# Patient Record
Sex: Male | Born: 1980 | Race: Black or African American | Hispanic: No | Marital: Single | Smoking: Never smoker
Health system: Southern US, Community
[De-identification: ages and names within clinical notes are randomized; demographics above are authoritative.]

## PROBLEM LIST (undated history)

## (undated) DIAGNOSIS — Z789 Other specified health status: Secondary | ICD-10-CM

## (undated) HISTORY — PX: KNEE ARTHROSCOPY: SHX127

## (undated) HISTORY — PX: HERNIA REPAIR: SHX51

---

## 2012-12-29 ENCOUNTER — Emergency Department: Payer: Self-pay | Admitting: Emergency Medicine

## 2013-03-02 ENCOUNTER — Emergency Department: Payer: Self-pay | Admitting: Emergency Medicine

## 2014-09-02 ENCOUNTER — Emergency Department: Payer: Self-pay | Admitting: Emergency Medicine

## 2014-09-11 ENCOUNTER — Ambulatory Visit
Admit: 2014-09-11 | Disposition: A | Discharge status: Home / Self Care | Payer: Self-pay | Attending: Orthopedic Surgery | Admitting: Orthopedic Surgery

## 2014-09-11 LAB — URINALYSIS, COMPLETE
Bilirubin,UR: NEGATIVE
Glucose,UR: NEGATIVE mg/dL (ref 0–75)
Ketone: NEGATIVE
LEUKOCYTE ESTERASE: NEGATIVE
Nitrite: NEGATIVE
PH: 5 (ref 4.5–8.0)
Protein: NEGATIVE
Specific Gravity: 1.01 (ref 1.003–1.030)

## 2014-09-11 LAB — CBC
HCT: 45.6 % (ref 40.0–52.0)
HGB: 15.1 g/dL (ref 13.0–18.0)
MCH: 30.7 pg (ref 26.0–34.0)
MCHC: 33.1 g/dL (ref 32.0–36.0)
MCV: 93 fL (ref 80–100)
PLATELETS: 327 10*3/uL (ref 150–440)
RBC: 4.92 10*6/uL (ref 4.40–5.90)
RDW: 13.2 % (ref 11.5–14.5)
WBC: 4.5 10*3/uL (ref 3.8–10.6)

## 2014-09-11 LAB — BASIC METABOLIC PANEL
Anion Gap: 10 (ref 7–16)
BUN: 11 mg/dL
CALCIUM: 9.4 mg/dL
CO2: 26 mmol/L
CREATININE: 1.2 mg/dL
Chloride: 107 mmol/L
EGFR (African American): >60
EGFR (Non-African Amer.): >60
Glucose: 119 mg/dL — ABNORMAL HIGH
POTASSIUM: 3.8 mmol/L
SODIUM: 143 mmol/L

## 2014-09-11 LAB — PROTIME-INR
INR: 1
Prothrombin Time: 13.3 secs

## 2014-09-11 LAB — APTT: ACTIVATED PTT: 33.3 s (ref 23.6–35.9)

## 2014-10-05 NOTE — Op Note (Signed)
PATIENT NAME:  Jose Lambert, Jose Lambert MR#:  [id number] DATE OF BIRTH:  03/14/1981  DATE OF PROCEDURE:  09/11/2014  PREOPERATIVE DIAGNOSIS: Left patella tendon rupture.   POSTOPERATIVE DIAGNOSIS: Left patella tendon rupture.  PROCEDURE: Open left knee patella tendon repair.   ANESTHESIA: General with local (0.25% Marcaine plain).   SURGEON: Thornton Park, MD.   ESTIMATED BLOOD LOSS: 100 mL.   COMPLICATIONS: None.   INDICATIONS FOR PROCEDURE: The patient is a 34 year old male, who is approximately 1 week out from an injury to his left knee. He was playing basketball. When he went up for a layup, he felt a pop in his left knee. The patient has a palpable defect in this patella tendon is unable to actively extend his leg and perform a straight leg raise. I recommended a surgical repair to the left knee and then the patient has agreed. He understands the risks include infection, bleeding, nerve or blood vessel injury, knee stiffness, persistent left knee pain, rerupture of the patella tendon, development of arthritis, and the need for further surgery. Medical risks include, but are not limited to deep vein thrombosis and pulmonary embolism, myocardial infarction, stroke, pneumonia, respiratory failure and death. The patient understood these risks and wished to proceed.   PROCEDURE NOTE : The patient was met in preoperative area. I marked the left knee with my initials and the word "yes" according to the hospital's correct site of surgery protocol. I answered all questions by the patient and his mother. He was then brought to the Operating Room where he was placed supine on the operative table. He was prepped and draped in a sterile fashion. A timeout was performed to verify the patient's name, date of birth, medical record number, correct site of surgery and correct procedure to be performed. It was also used to verify the patient had received antibiotics and appropriate instruments, implants, and  radiographic studies were available in the room. Once all in attendance were in agreement, the case began.   DESCRIPTION OF PROCEDURE: The patient had a midline incision made extending from the tibial tubercle to just superior to the patella. The subcutaneous tissues were carefully dissected with a Metzenbaum scissor and pickup. The peritenon of the patella tendon was sharply incised with a #15 blade. Full thickness skin flaps had been developed with a #10 blade. The patella tendon was then easily identified. It was whipstitched with #5 fiber wire, both on the medial and lateral side. Once it was completely whipstitched, there were 4 strands of suture. The knee joint was copiously irrigated. All hemarthrosis was suctioned out. The patient had torn his medial and lateral retinaculum as well. There were no focal chondral defects of the patella or trochlea of the femur. The torn patella tendon fibers were debrided off the inferior pole of the patella. The sutures from the patellar tendon were then advanced through bone tunnels in the patella, one centrally and one on either side of the central ridge. These sutures were passed through the patella using a long drill needle. The eyelid of these drill pins allowed for passage of the suture through the tendon. Two suture limbs were placed in the central hole and one through each of the side holes. The tendons of the sutures were then tied tightly down to approximate the patella with the patellar tendon. These were tied over the superior aspect of the patella. Once well approximated, the patella tendon and patella were oversewed with 0 Ethibond. The 0 Ethibond was also used to repair  the medial lateral retinaculum. The wound was copiously irrigated. The peritenon was then repaired using a 3-0 Vicryl. The subcutaneous tissue was closed with an interrupted 0 Vicryl and the subcutaneous tissue was closed with a 2-0 Vicryl. The patient's skin was approximated with staples. A  dry sterile and compressive dressing was applied over the left knee, along with a Polar Care sleeve and a hinged knee brace locked in extension. I was scrubbed and present for the entire case, and all sharp and instrument counts were correct at the conclusion of the case. The patient was then brought to the PACU in stable condition. I spoke with his mother in the postoperative consultation room to let her know the case had gone without complication. The patient was stable in the recovery room.    ____________________________ Timoteo Gaul, MD klk:AT D: 09/12/2014 13:51:23 ET T: 09/12/2014 14:10:06 ET JOB#: [id number]  cc: Timoteo Gaul, MD, <Dictator> Timoteo Gaul MD ELECTRONICALLY SIGNED 10/01/2014 13:24

## 2014-12-02 ENCOUNTER — Encounter: Payer: Self-pay | Admitting: *Deleted

## 2014-12-02 ENCOUNTER — Other Ambulatory Visit: Payer: Self-pay

## 2014-12-02 NOTE — Patient Instructions (Signed)
  Your procedure is scheduled on: 12-05-14 Report to [facility] 2ND FLOOR To find out your arrival time please call [phone] between 1PM - 3PM on 12-04-14 (THURSDAY)  Remember: Instructions that are not followed completely may result in serious medical risk, up to and including death, or upon the discretion of your surgeon and anesthesiologist your surgery may need to be rescheduled.    _X___ 1. Do not eat food or drink liquids after midnight. No gum chewing or hard candies.     _X___ 2. No Alcohol for 24 hours before or after surgery.   ____ 3. Bring all medications with you on the day of surgery if instructed.    _X___ 4. Notify your doctor if there is any change in your medical condition     (cold, fever, infections).     Do not wear jewelry, make-up, hairpins, clips or nail polish.  Do not wear lotions, powders, or perfumes. You may wear deodorant.  Do not shave 48 hours prior to surgery. Men may shave face and neck.  Do not bring valuables to the hospital.    [facility] for any belongings or valuables.               Contacts, dentures or bridgework may not be worn into surgery.  Leave your suitcase in the car. After surgery it may be brought to your room.  For patients admitted to the hospital, discharge time is determined by your treatment team.   Patients discharged the day of surgery will not be allowed to drive home.   Please read over the following fact sheets that you were given:    _X___ Take these medicines the morning of surgery with A SIP OF WATER:    1. MAY TAKE VICODIN IF NEEDED WITH A SMALL SIP OF WATER  2.   3.   4.  5.  6.  ____ Fleet Enema (as directed)   _X___ Use CHG Soap as directed  ____ Use inhalers on the day of surgery  ____ Stop metformin 2 days prior to surgery    ____ Take 1/2 of usual insulin dose the night before surgery and none on the morning of surgery.   ____ Stop  Coumadin/Plavix/aspirin-N/A  ____ Stop Anti-inflammatories-NO NSAIDS OR ASPIRIN PRODUCTS-VICODIN OK TO CONTINUE   ____ Stop supplements until after surgery.    ____ Bring C-Pap to the hospital.

## 2014-12-03 ENCOUNTER — Encounter
Admission: RE | Admit: 2014-12-03 | Discharge: 2014-12-03 | Disposition: A | Discharge status: Home / Self Care | Payer: Self-pay | Source: Ambulatory Visit | Attending: Orthopedic Surgery | Admitting: Orthopedic Surgery

## 2014-12-03 DIAGNOSIS — Z01812 Encounter for preprocedural laboratory examination: Secondary | ICD-10-CM | POA: Insufficient documentation

## 2014-12-03 LAB — URINALYSIS COMPLETE WITH MICROSCOPIC (ARMC ONLY)
BILIRUBIN URINE: NEGATIVE
Bacteria, UA: NONE SEEN
GLUCOSE, UA: NEGATIVE mg/dL
KETONES UR: NEGATIVE mg/dL
Leukocytes, UA: NEGATIVE
Nitrite: NEGATIVE
PH: 6 (ref 5.0–8.0)
Protein, ur: NEGATIVE mg/dL
Specific Gravity, Urine: 1.018 (ref 1.005–1.030)

## 2014-12-03 LAB — APTT: aPTT: 35 seconds (ref 24–36)

## 2014-12-03 LAB — BASIC METABOLIC PANEL
ANION GAP: 10 (ref 5–15)
BUN: 13 mg/dL (ref 6–20)
CHLORIDE: 105 mmol/L (ref 101–111)
CO2: 27 mmol/L (ref 22–32)
Calcium: 9.7 mg/dL (ref 8.9–10.3)
Creatinine, Ser: 1.26 mg/dL — ABNORMAL HIGH (ref 0.61–1.24)
GFR calc Af Amer: >60 mL/min (ref >60)
GFR calc non Af Amer: >60 mL/min (ref >60)
Glucose, Bld: 91 mg/dL (ref 65–99)
Potassium: 4 mmol/L (ref 3.5–5.1)
SODIUM: 142 mmol/L (ref 135–145)

## 2014-12-03 LAB — CBC
HCT: 40.9 % (ref 40.0–52.0)
Hemoglobin: 13.8 g/dL (ref 13.0–18.0)
MCH: 31.3 pg (ref 26.0–34.0)
MCHC: 33.7 g/dL (ref 32.0–36.0)
MCV: 92.9 fL (ref 80.0–100.0)
Platelets: 343 10*3/uL (ref 150–440)
RBC: 4.4 MIL/uL (ref 4.40–5.90)
RDW: 13.8 % (ref 11.5–14.5)
WBC: 6.9 10*3/uL (ref 3.8–10.6)

## 2014-12-03 LAB — PROTIME-INR
INR: 1.13
Prothrombin Time: 14.7 seconds (ref 11.4–15.0)

## 2014-12-05 ENCOUNTER — Observation Stay
Admission: RE | Admit: 2014-12-05 | Discharge: 2014-12-06 | Disposition: A | Discharge status: Home / Self Care | Payer: Self-pay | Source: Ambulatory Visit | Attending: Orthopedic Surgery | Admitting: Orthopedic Surgery

## 2014-12-05 ENCOUNTER — Encounter: Payer: Self-pay | Admitting: *Deleted

## 2014-12-05 ENCOUNTER — Encounter
Admission: RE | Disposition: A | Discharge status: Home / Self Care | Payer: Self-pay | Source: Ambulatory Visit | Attending: Orthopedic Surgery

## 2014-12-05 ENCOUNTER — Ambulatory Visit: Payer: MEDICAID | Admitting: Anesthesiology

## 2014-12-05 ENCOUNTER — Ambulatory Visit: Payer: Self-pay

## 2014-12-05 ENCOUNTER — Ambulatory Visit: Payer: Self-pay | Admitting: Anesthesiology

## 2014-12-05 DIAGNOSIS — S76112A Strain of left quadriceps muscle, fascia and tendon, initial encounter: Principal | ICD-10-CM | POA: Insufficient documentation

## 2014-12-05 DIAGNOSIS — Z7982 Long term (current) use of aspirin: Secondary | ICD-10-CM | POA: Insufficient documentation

## 2014-12-05 DIAGNOSIS — S86819A Strain of other muscle(s) and tendon(s) at lower leg level, unspecified leg, initial encounter: Secondary | ICD-10-CM | POA: Diagnosis present

## 2014-12-05 DIAGNOSIS — I10 Essential (primary) hypertension: Secondary | ICD-10-CM | POA: Insufficient documentation

## 2014-12-05 DIAGNOSIS — Z79899 Other long term (current) drug therapy: Secondary | ICD-10-CM | POA: Insufficient documentation

## 2014-12-05 DIAGNOSIS — W19XXXA Unspecified fall, initial encounter: Secondary | ICD-10-CM | POA: Insufficient documentation

## 2014-12-05 HISTORY — DX: Other specified health status: Z78.9

## 2014-12-05 HISTORY — PX: PATELLAR TENDON REPAIR: SHX737

## 2014-12-05 SURGERY — REPAIR, TENDON, PATELLAR
Anesthesia: General | Site: Knee | Laterality: Left | Wound class: Clean

## 2014-12-05 MED ORDER — ACETAMINOPHEN 325 MG PO TABS: 650.0000 mg | ORAL_TABLET | Freq: Four times a day (QID) | ORAL | Status: DC | PRN

## 2014-12-05 MED ORDER — BISACODYL 5 MG PO TBEC: 5.0000 mg | DELAYED_RELEASE_TABLET | Freq: Every day | ORAL | Status: DC | PRN

## 2014-12-05 MED ORDER — LIDOCAINE HCL (CARDIAC) 20 MG/ML IV SOLN
INTRAVENOUS | Status: DC | PRN
  Administered 2014-12-05: 50 mg via INTRAVENOUS

## 2014-12-05 MED ORDER — MIDAZOLAM HCL 2 MG/2ML IJ SOLN
INTRAMUSCULAR | Status: DC | PRN
  Administered 2014-12-05: 2 mg via INTRAVENOUS

## 2014-12-05 MED ORDER — HYDROMORPHONE HCL 1 MG/ML IJ SOLN
INTRAMUSCULAR | Status: AC
  Filled 2014-12-05: qty 1

## 2014-12-05 MED ORDER — KETOROLAC TROMETHAMINE 15 MG/ML IJ SOLN
15.0000 mg | Freq: Four times a day (QID) | INTRAMUSCULAR | Status: DC
  Administered 2014-12-05 – 2014-12-06 (×3): 15 mg via INTRAVENOUS
  Filled 2014-12-05 (×3): qty 1

## 2014-12-05 MED ORDER — MAGNESIUM HYDROXIDE 400 MG/5ML PO SUSP: 30.0000 mL | Freq: Every day | ORAL | Status: DC | PRN

## 2014-12-05 MED ORDER — LACTATED RINGERS IV SOLN
INTRAVENOUS | Status: DC | PRN
  Administered 2014-12-05 (×2): via INTRAVENOUS

## 2014-12-05 MED ORDER — OXYCODONE HCL 5 MG PO TABS
5.0000 mg | ORAL_TABLET | ORAL | Status: DC | PRN
  Administered 2014-12-05 – 2014-12-06 (×3): 5 mg via ORAL
  Filled 2014-12-05 (×3): qty 1

## 2014-12-05 MED ORDER — ACETAMINOPHEN 650 MG RE SUPP: 650.0000 mg | Freq: Four times a day (QID) | RECTAL | Status: DC | PRN

## 2014-12-05 MED ORDER — ONDANSETRON HCL 4 MG PO TABS: 4.0000 mg | ORAL_TABLET | Freq: Four times a day (QID) | ORAL | Status: DC | PRN

## 2014-12-05 MED ORDER — PROPOFOL 10 MG/ML IV BOLUS
INTRAVENOUS | Status: DC | PRN
  Administered 2014-12-05: 150 mg via INTRAVENOUS
  Administered 2014-12-05: 50 mg via INTRAVENOUS

## 2014-12-05 MED ORDER — FAMOTIDINE 20 MG PO TABS
ORAL_TABLET | ORAL | Status: AC
  Filled 2014-12-05: qty 1

## 2014-12-05 MED ORDER — ACETAMINOPHEN 10 MG/ML IV SOLN
INTRAVENOUS | Status: DC | PRN
  Administered 2014-12-05: 1000 mg via INTRAVENOUS

## 2014-12-05 MED ORDER — NEOMYCIN-POLYMYXIN B GU 40-200000 IR SOLN
Status: DC | PRN
  Administered 2014-12-05: 16 mL

## 2014-12-05 MED ORDER — HYDROMORPHONE HCL 1 MG/ML IJ SOLN
INTRAMUSCULAR | Status: AC
  Administered 2014-12-05: 0.5 mg via INTRAVENOUS
  Filled 2014-12-05: qty 1

## 2014-12-05 MED ORDER — NEOMYCIN-POLYMYXIN B GU 40-200000 IR SOLN
Status: AC
  Filled 2014-12-05: qty 20

## 2014-12-05 MED ORDER — BUPIVACAINE HCL 0.25 % IJ SOLN
INTRAMUSCULAR | Status: DC | PRN
  Administered 2014-12-05: 30 mL

## 2014-12-05 MED ORDER — HYDROMORPHONE HCL 1 MG/ML IJ SOLN
0.2500 mg | INTRAMUSCULAR | Status: DC | PRN
  Administered 2014-12-05 (×4): 0.5 mg via INTRAVENOUS

## 2014-12-05 MED ORDER — DOCUSATE SODIUM 100 MG PO CAPS
100.0000 mg | ORAL_CAPSULE | Freq: Two times a day (BID) | ORAL | Status: DC
  Administered 2014-12-05 – 2014-12-06 (×2): 100 mg via ORAL
  Filled 2014-12-05 (×2): qty 1

## 2014-12-05 MED ORDER — FENTANYL CITRATE (PF) 100 MCG/2ML IJ SOLN
INTRAMUSCULAR | Status: DC | PRN
Start: 2014-12-05 — End: 2014-12-05
  Administered 2014-12-05 (×4): 50 ug via INTRAVENOUS
  Administered 2014-12-05: 100 ug via INTRAVENOUS
  Administered 2014-12-05 (×2): 50 ug via INTRAVENOUS

## 2014-12-05 MED ORDER — CEFAZOLIN SODIUM-DEXTROSE 2-3 GM-% IV SOLR
INTRAVENOUS | Status: DC | PRN
  Administered 2014-12-05: 2 g via INTRAVENOUS

## 2014-12-05 MED ORDER — FENTANYL CITRATE (PF) 100 MCG/2ML IJ SOLN
INTRAMUSCULAR | Status: AC
  Administered 2014-12-05: 25 ug via INTRAVENOUS
  Filled 2014-12-05: qty 2

## 2014-12-05 MED ORDER — HYDROMORPHONE HCL 1 MG/ML IJ SOLN
1.0000 mg | INTRAMUSCULAR | Status: DC | PRN
  Administered 2014-12-05 – 2014-12-06 (×2): 1 mg via INTRAVENOUS
  Filled 2014-12-05 (×2): qty 1

## 2014-12-05 MED ORDER — ACETAMINOPHEN 500 MG PO TABS
1000.0000 mg | ORAL_TABLET | Freq: Four times a day (QID) | ORAL | Status: DC
  Administered 2014-12-05 – 2014-12-06 (×3): 1000 mg via ORAL
  Filled 2014-12-05 (×5): qty 2

## 2014-12-05 MED ORDER — SODIUM CHLORIDE 0.9 % IV SOLN
INTRAVENOUS | Status: DC
  Administered 2014-12-05 – 2014-12-06 (×2): via INTRAVENOUS

## 2014-12-05 MED ORDER — ASPIRIN EC 325 MG PO TBEC
325.0000 mg | DELAYED_RELEASE_TABLET | Freq: Two times a day (BID) | ORAL | Status: DC
  Administered 2014-12-05 – 2014-12-06 (×2): 325 mg via ORAL
  Filled 2014-12-05 (×2): qty 1

## 2014-12-05 MED ORDER — ONDANSETRON HCL 4 MG/2ML IJ SOLN
INTRAMUSCULAR | Status: DC | PRN
  Administered 2014-12-05: 4 mg via INTRAVENOUS

## 2014-12-05 MED ORDER — BUPIVACAINE HCL (PF) 0.25 % IJ SOLN
INTRAMUSCULAR | Status: AC
  Filled 2014-12-05: qty 30

## 2014-12-05 MED ORDER — FENTANYL CITRATE (PF) 100 MCG/2ML IJ SOLN
25.0000 ug | INTRAMUSCULAR | Status: DC | PRN
  Administered 2014-12-05 (×4): 25 ug via INTRAVENOUS

## 2014-12-05 MED ORDER — ONDANSETRON HCL 4 MG/2ML IJ SOLN: 4.0000 mg | Freq: Four times a day (QID) | INTRAMUSCULAR | Status: DC | PRN

## 2014-12-05 MED ORDER — MAGNESIUM CITRATE PO SOLN: 1.0000 | Freq: Once | ORAL | Status: AC | PRN

## 2014-12-05 MED ORDER — NEOMYCIN-POLYMYXIN B GU 40-200000 IR SOLN
Status: AC
  Filled 2014-12-05: qty 4

## 2014-12-05 MED ORDER — DIPHENHYDRAMINE HCL 12.5 MG/5ML PO ELIX: 12.5000 mg | ORAL_SOLUTION | ORAL | Status: DC | PRN

## 2014-12-05 MED ORDER — CEFAZOLIN SODIUM 1-5 GM-% IV SOLN
1.0000 g | Freq: Four times a day (QID) | INTRAVENOUS | Status: AC
  Administered 2014-12-05 – 2014-12-06 (×3): 1 g via INTRAVENOUS
  Filled 2014-12-05 (×4): qty 50

## 2014-12-05 MED ORDER — ACETAMINOPHEN 10 MG/ML IV SOLN
INTRAVENOUS | Status: AC
  Filled 2014-12-05: qty 100

## 2014-12-05 SURGICAL SUPPLY — 49 items
BLADE SURG SZ10 CARB STEEL (BLADE) ×6 IMPLANT
BNDG ESMARK 6X12 TAN STRL LF (GAUZE/BANDAGES/DRESSINGS) ×3 IMPLANT
BRACE KNEE POST OP SHORT (BRACE) IMPLANT
BUR SURG 4X8 MED (BURR) IMPLANT
BURR SURG 4MMX8MM MEDIUM (BURR)
BURR SURG 4X8 MED (BURR)
CANISTER SUCT 1200ML W/VALVE (MISCELLANEOUS) ×3 IMPLANT
COOLER POLAR GLACIER W/PUMP (MISCELLANEOUS) IMPLANT
DECANTER SPIKE VIAL GLASS SM (MISCELLANEOUS) ×3 IMPLANT
DRAPE U-SHAPE 47X51 STRL (DRAPES) ×3 IMPLANT
DURAPREP 26ML APPLICATOR (WOUND CARE) ×9 IMPLANT
GAUZE SPONGE 4X4 12PLY STRL (GAUZE/BANDAGES/DRESSINGS) ×3 IMPLANT
GLOVE BIOGEL PI IND STRL 9 (GLOVE) ×1 IMPLANT
GLOVE BIOGEL PI INDICATOR 9 (GLOVE) ×2
GLOVE SURG 9.0 ORTHO LTXF (GLOVE) ×6 IMPLANT
GOWN STRL REUS TWL 2XL XL LVL4 (GOWN DISPOSABLE) ×3 IMPLANT
GOWN STRL REUS W/ TWL LRG LVL3 (GOWN DISPOSABLE) ×1 IMPLANT
GOWN STRL REUS W/TWL LRG LVL3 (GOWN DISPOSABLE) ×2
HANDPIECE SUCTION TUBG SURGILV (MISCELLANEOUS) ×3 IMPLANT
IMMBOLIZER KNEE 19 BLUE UNIV (SOFTGOODS) IMPLANT
KIT RM TURNOVER STRD PROC AR (KITS) ×3 IMPLANT
NDL SAFETY 18GX1.5 (NEEDLE) ×3 IMPLANT
PACK EXTREMITY ARMC (MISCELLANEOUS) ×3 IMPLANT
PAD GROUND ADULT SPLIT (MISCELLANEOUS) ×3 IMPLANT
PAD WRAPON POLAR KNEE (MISCELLANEOUS) IMPLANT
RETRIEVER SUT HEWSON (MISCELLANEOUS) ×6 IMPLANT
SPONGE LAP 18X18 5 PK (GAUZE/BANDAGES/DRESSINGS) ×6 IMPLANT
STAPLER SKIN PROX 35W (STAPLE) ×3 IMPLANT
STOCKINETTE M/LG 89821 (MISCELLANEOUS) ×3 IMPLANT
SUT ETHIBOND 5-0 MS/4 CCS GRN (SUTURE)
SUT FIBERWIRE #2 38 BLUE 1/2 (SUTURE) ×3
SUT FIBERWIRE #2 38 T-5 BLUE (SUTURE) ×9
SUT FIBERWIRE #5 38 CONV BLUE (SUTURE) ×12
SUT ORTHOCORD OS-6 NDL 36 (SUTURE) IMPLANT
SUT ORTHOCORD W/MULTIPK NDL (SUTURE) IMPLANT
SUT TICRON 2-0 30IN 311381 (SUTURE) IMPLANT
SUT VIC AB 0 CT1 36 (SUTURE) ×3 IMPLANT
SUT VIC AB 2-0 CT1 (SUTURE) ×3 IMPLANT
SUT VICRYL 1-0 27IN AB (SUTURE) ×1
SUT VICRYL 1-0 27IN ABS (SUTURE) ×2
SUTURE ETHBND 5-0 MS/4 CCS GRN (SUTURE) IMPLANT
SUTURE FIBERWR #2 38 BLUE 1/2 (SUTURE) ×1 IMPLANT
SUTURE FIBERWR #2 38 T-5 BLUE (SUTURE) ×3 IMPLANT
SUTURE FIBERWR #5 38 CONV BLUE (SUTURE) ×4 IMPLANT
SUTURE VICRYL 1-0 27IN AB (SUTURE) ×1 IMPLANT
SYR 20CC LL (SYRINGE) ×3 IMPLANT
TUBING CONNECTING 10 (TUBING) ×2 IMPLANT
TUBING CONNECTING 10' (TUBING) ×1
WRAPON POLAR PAD KNEE (MISCELLANEOUS)

## 2014-12-05 NOTE — Progress Notes (Signed)
  Subjective:  Postoperative check.   Patient reports pain as mild.  Patient has no other complaints.  Objective:   VITALS:   Filed Vitals:   12/05/14 1620 12/05/14 1630 12/05/14 1645 12/05/14 1716  BP:  159/87 143/87 171/89  Pulse: 115 111 112 112  Temp:   98 F (36.7 C) 98.2 F (36.8 C)  TempSrc:    Oral  Resp:      Height:      Weight:      SpO2: 96% 95% 95% 94%   Left lower extremity: Neurologically intact Neurovascular intact Sensation intact distally Intact pulses distally Dorsiflexion/Plantar flexion intact Incision: dressing C/D/I Compartment soft  LABS  No results found for this or any previous visit (from the past 24 hour(s)).  Dg Knee Left Port  12/05/2014   CLINICAL DATA:  Patellar tendon rupture. Patellar tendon repair. Postoperative radiographs.  EXAM: PORTABLE LEFT KNEE - 1-2 VIEW  COMPARISON:  09/02/2014.  FINDINGS: Patellar position has been restored on the lateral and frontal projections. Surgical staples and expected postoperative soft tissue gas noted. The alignment of the knee appears anatomic.  IMPRESSION: Expected postsurgical findings of patellar tendon repair.   Electronically Signed   By: Andreas NewportGeoffrey  Lamke M.D.   On: 12/05/2014 16:33    Assessment/Plan: Day of Surgery   Active Problems:   Patellar tendon rupture  Patient is doing well postop. He has mild pain. Nurses noted mild hypertension which may be pain related. Patient is admitted for observation for postoperative pain control and IV antibiotics. He'll receive physical therapy in the morning and will likely be discharged tomorrow.    Juanell FairlyKRASINSKI, Klare Criss , MD 12/05/2014, 6:13 PM

## 2014-12-05 NOTE — Anesthesia Postprocedure Evaluation (Signed)
  Anesthesia Post-op Note  Patient: Jose FlatterySidney K Ontiveros  Procedure(s) Performed: Procedure(s): PATELLA TENDON REPAIR (Left)  Anesthesia type:General LMA  Patient location: PACU  Post pain: Pain level controlled  Post assessment: Post-op Vital signs reviewed, Patient's Cardiovascular Status Stable, Respiratory Function Stable, Patent Airway and No signs of Nausea or vomiting  Post vital signs: Reviewed and stable  Last Vitals:  Filed Vitals:   12/05/14 1716  BP: 171/89  Pulse: 112  Temp: 36.8 C  Resp:     Level of consciousness: awake, alert  and patient cooperative  Complications: No apparent anesthesia complications

## 2014-12-05 NOTE — H&P (Signed)
PREOPERATIVE H&P  Chief Complaint: RUPTURE OF PATELLA TENDON  HPI: Jose Lambert is a 34 y.o. male who presents for preoperative history and physical with a diagnosis of RE-RUPTURE OF PATELLA TENDON after a recent fall. He is unable to ambulate on his left leg as result of this injury. This is significantly impairing activities of daily living.  He has elected for surgical management of this injury.   Past Medical History  Diagnosis Date  . Medical history non-contributory    Past Surgical History  Procedure Laterality Date  . Hernia repair    . Knee arthroscopy Left    History   Social History  . Marital Status: Single    Spouse Name: N/A  . Number of Children: N/A  . Years of Education: N/A   Social History Main Topics  . Smoking status: Never Smoker   . Smokeless tobacco: Not on file  . Alcohol Use: No  . Drug Use: No  . Sexual Activity: Not on file   Other Topics Concern  . None   Social History Narrative   History reviewed. No pertinent family history. No Known Allergies Prior to Admission medications   Medication Sig Start Date End Date Taking? Authorizing Provider  HYDROcodone-acetaminophen (NORCO/VICODIN) 5-325 MG per tablet Take 1 tablet by mouth every 6 (six) hours as needed for moderate pain.   Yes Historical Provider, MD     Positive ROS: All other systems have been reviewed and were otherwise negative with the exception of those mentioned in the HPI and as above.  Physical Exam: General: Alert, no acute distress Cardiovascular: Regular rate and rhythm, no murmurs rubs or gallops.  No pedal edema Respiratory: Clear to auscultation bilaterally, no wheezes rales or rhonchi. No cyanosis, no use of accessory musculature GI: No organomegaly, abdomen is soft and non-tender nondistended with positive bowel sounds. Skin: Skin intact, no lesions within the operative field. Neurologic: Sensation intact distally Psychiatric: Patient is competent for consent  with normal mood and affect Lymphatic: No axillary or cervical lymphadenopathy  MUSCULOSKELETAL: Left knee: Incision with scant drainage from the inferior pole. Drainage is serosanguineous. Patient cannot perform a straight leg raise. His leg compartments are soft and compressible. He has a large knee effusion. Patient has intact sensation to light touch in palpable pedal pulses. He has intact motor function distally in the left lower extremity. He has tenderness to palpation at the inferior pole. There is a palpable defect between the patella and patella tendon.  Assessment: RE-RUPTURE OF LEFT PATELLA TENDON  Plan: Plan for Procedure(s): LEFT OPEN REVISION PATELLA TENDON REPAIR  Given the patient's young age and high activity level I recommended repair of the patellar tendon. Patient had an unfortunate second injury which caused rerupture of his already repaired left patella tendon.  I discussed the risks and benefits of surgery. The risks include but are not limited to infection, bleeding requiring blood transfusion, nerve or blood vessel injury, joint stiffness or loss of motion, persistent pain, weakness or instability, malunion, nonunion and hardware failure and the need for further surgery. Medical risks include but are not limited to DVT and pulmonary embolism, myocardial infarction, stroke, pneumonia, respiratory failure and death. Patient understood these risks and wished to proceed.   Juanell FairlyKRASINSKI, Helaine Yackel, MD   12/05/2014 12:54 PM

## 2014-12-05 NOTE — Op Note (Signed)
12/05/2014  4:51 PM  PATIENT:  Jose Lambert    PRE-OPERATIVE DIAGNOSIS:  RUPTURE OF PATELLA TENDON left knee  POST-OPERATIVE DIAGNOSIS:  Same  PROCEDURE:  PATELLA TENDON REPAIR  SURGEON:  Juanell Fairly, MD  ANESTHESIA:   General  PREOPERATIVE INDICATIONS:  Jose Lambert is a  34 y.o. male with a diagnosis of RE-RUPTURE OF PATELLA TENDON left knee who failed conservative measures and elected for surgical management.    The risks benefits and alternatives were discussed with the patient preoperatively including but not limited to the risks of infection, bleeding, nerve injury, knee stiffness, re-rupture, persistent pain, DVT and pulmonary embolism, myocardial infarction, stroke, pneumonia, respiratory failure and death. The patient understood these risks and was willing to proceed.  OPERATIVE IMPLANTS: None  OPERATIVE FINDINGS: Complete rupture of the left patella tendon from the inferior pole of the patella  OPERATIVE PROCEDURE:   Patient had his left knee marked with my initials and the word yes according the to the hospital's correct site of surgery protocol. He was brought to the operating room where he underwent general anesthesia. He was prepped and draped in a sterile fashion. He received 2 g of Ancef. A timeout was performed to verify the patient's name, date of birth, medical record number, correct site of surgery and correct procedure to be performed. The timeout was also used to confirm the patient received antibiotics and that all appropriate instruments were available in the room. Once all in attendance were in agreement case began.  The previous midline incision was reopened over the anterior knee and full thickness skin flaps were developed. A large hemarthrosis was evacuated. The peritenon over the patellar tendon was dissected off the patellar tendon for later repair. Moderate bleeding was encountered and therefore the knee was exsanguinated with an Esmarch and the  tourniquet inflated to 275 mmHg. The proximal end of the patella tendon and inferior pole of the patella were debrided of remaining scar tissue or torn fibers.  There was no evidence of chondral injury to the femoral trochlea or undersurface of the patella upon inspection during this case. Two #5 FiberWire sutures were then used in a whipstitch fashion to prepare the patella tendon for repair.  Three vertical tunnels were drilled through the patella with a 2.5 mm drill bit. A single #5 FiberWire suture limb was shuttled through both the medial and lateral most bone tunnels. Through the central tunnel, the 2 central FiberWire limbs were shuttled. The suture limbs were then tied down over the superior patella. This approximated the patella with the patella tendon. The medial and lateral retinaculum were also repaired using  #2 FiberWire in an interrupted fashion. A second row of repair between the inferior pole of the patella and the patella tendon was performed using interrupted #2 FiberWire as well.   The joint was then copiously irrigated. The peritenon over the patella tendon was then repaired using a 0 Vicryls. The subcutaneous tissue was approximated using 2-0 Vicryl and the skin approximated with staples. Xeroform and a dry sterile dressing were applied after quarter percent Marcaine plain was injected into the skin and knee joint.  The tourniquet was deflated. The patient had his hinged knee brace reapplied to the left knee, locked in extension.  The patient was brought to the PACU in stable condition. I spoke with his family in the postop consultation room. Patient is being admitted for postoperative observation for pain control and IV antibiotics. I was scrubbed and present the entire  case and all sharp and instrument counts were correct at the conclusion of the case. Patient will be touchdown weightbearing on the left lower extremity with crutches postoperatively.

## 2014-12-05 NOTE — Transfer of Care (Signed)
Immediate Anesthesia Transfer of Care Note  Patient: Jose FlatterySidney K Lambert  Procedure(s) Performed: Procedure(s): PATELLA TENDON REPAIR (Left)  Patient Location: PACU  Anesthesia Type:General  Level of Consciousness: awake and patient cooperative  Airway & Oxygen Therapy: Patient Spontanous Breathing and Patient connected to face mask oxygen  Post-op Assessment: Report given to RN and Post -op Vital signs reviewed and stable  Post vital signs: Reviewed and stable  Last Vitals:  Filed Vitals:   12/05/14 1557  BP: 153/96  Pulse: 103  Temp: 36.9 C  Resp: 16    Complications: No apparent anesthesia complications

## 2014-12-05 NOTE — Anesthesia Preprocedure Evaluation (Signed)
Anesthesia Evaluation  Patient identified by MRN, date of birth, ID band Patient awake    Reviewed: Allergy & Precautions, H&P , NPO status , Patient's Chart, lab work & pertinent test results  Airway Mallampati: II  TM Distance: >3 FB Neck ROM: full    Dental no notable dental hx. (+) Teeth Intact   Pulmonary neg pulmonary ROS,    Pulmonary exam normal       Cardiovascular negative cardio ROS Normal cardiovascular exam    Neuro/Psych    GI/Hepatic negative GI ROS, Neg liver ROS,   Endo/Other  negative endocrine ROS  Renal/GU negative Renal ROS     Musculoskeletal   Abdominal   Peds  Hematology negative hematology ROS (+)   Anesthesia Other Findings   Reproductive/Obstetrics                             Anesthesia Physical Anesthesia Plan  ASA: I  Anesthesia Plan: General LMA   Post-op Pain Management:    Induction:   Airway Management Planned:   Additional Equipment:   Intra-op Plan:   Post-operative Plan:   Informed Consent:   Dental Advisory Given  Plan Discussed with: Anesthesiologist, CRNA and Surgeon  Anesthesia Plan Comments:         Anesthesia Quick Evaluation

## 2014-12-06 MED ORDER — BISACODYL 5 MG PO TBEC: 5.0000 mg | DELAYED_RELEASE_TABLET | Freq: Every day | ORAL | Status: AC | PRN

## 2014-12-06 MED ORDER — DOCUSATE SODIUM 100 MG PO CAPS: 100.0000 mg | ORAL_CAPSULE | Freq: Two times a day (BID) | ORAL | Status: AC

## 2014-12-06 MED ORDER — OXYCODONE HCL 5 MG PO TABS: 5.0000 mg | ORAL_TABLET | ORAL | Status: AC | PRN

## 2014-12-06 MED ORDER — ACETAMINOPHEN 325 MG PO TABS: 650.0000 mg | ORAL_TABLET | Freq: Four times a day (QID) | ORAL | Status: AC | PRN

## 2014-12-06 MED ORDER — ASPIRIN 325 MG PO TBEC: 325.0000 mg | DELAYED_RELEASE_TABLET | Freq: Two times a day (BID) | ORAL | Status: AC

## 2014-12-06 NOTE — Progress Notes (Addendum)
Order to discharge patient to home today. Discharge instructions given per MD order, home and new medications reviewed. Rx.slips given. Discharge via wheelchair with Massie BougieBelinda- Nurse Tech

## 2014-12-06 NOTE — Care Management Note (Signed)
Case Management Note  Patient Details  Name: Jose Lambert MRN: [id number] Date of Birth: 06/21/1980  Subjective/Objective:          Spoke with Jose Lambert on the phone and he has his own crutches in his hospital room to take home, and a knee brace. Reports that he has had this same surgery in the past.    Action/Plan:   Expected Discharge Date:  12/06/14               Expected Discharge Plan:     In-House Referral:     Discharge planning Services     Post Acute Care Choice:    Choice offered to:     DME Arranged:    DME Agency:     HH Arranged:    HH Agency:     Status of Service:     Medicare Important Message Given:    Date Medicare IM Given:    Medicare IM give by:    Date Additional Medicare IM Given:    Additional Medicare Important Message give by:     If discussed at Long Length of Stay Meetings, dates discussed:    Additional Comments:  Kaydynce Pat A, RN 12/06/2014, 11:14 AM

## 2014-12-06 NOTE — Evaluation (Signed)
Physical Therapy Evaluation Patient Details Name: Jose Lambert MRN: [id number] DOB: [birth year] Today's Date: 12/06/2014   History of Present Illness  34 yo who tripped on LLE and re-ruptured L patellar tendon after having recovered to one crutch  Clinical Impression  Pt was instructed on all points of gait, stairs, home exercises and referred to outpatient services as he will be able to climb stairs and go home.  His plan will be to follow up at [facility] clinic.    Follow Up Recommendations Outpatient PT;Supervision - Intermittent    Equipment Recommendations  None recommended by PT    Recommendations for Other Services       Precautions / Restrictions Precautions Precautions: Fall Restrictions Weight Bearing Restrictions: Yes Other Position/Activity Restrictions: TDWB      Mobility  Bed Mobility Overal bed mobility: Modified Independent                Transfers Overall transfer level: Modified independent Equipment used: Crutches             General transfer comment: was using previously  Ambulation/Gait Ambulation/Gait assistance: Min guard Ambulation Distance (Feet): 300 Feet Assistive device: Crutches   Gait velocity: reduced Gait velocity interpretation: Below normal speed for age/gender General Gait Details: step through with TDWB and practiced NWB LLE with no issues noted   Stairs Stairs: Yes Stairs assistance: Min guard Stair Management: No rails;With crutches;Forwards;Step to pattern Number of Stairs: 16 General stair comments: practiced flight with no rails due to home set up   Wheelchair Mobility    Modified Rankin (Stroke Patients Only)       Balance Overall balance assessment: Modified Independent;History of Falls                                           Pertinent Vitals/Pain Pain Assessment: No/denies pain    Home Living Family/patient expects to be discharged to:: Private residence Living Arrangements:  Parent Available Help at Discharge: Family Type of Home: Apartment Home Access: Stairs to enter Entrance Stairs-Rails: None Technical brewer of Steps: 2 Home Layout: One level Home Equipment: Crutches      Prior Function Level of Independence: Independent with assistive device(s)               Hand Dominance        Extremity/Trunk Assessment   Upper Extremity Assessment: Overall WFL for tasks assessed           Lower Extremity Assessment: LLE deficits/detail   LLE Deficits / Details: extension brace with locked knee  Cervical / Trunk Assessment: Normal  Communication   Communication: No difficulties  Cognition Arousal/Alertness: Awake/alert Behavior During Therapy: WFL for tasks assessed/performed Overall Cognitive Status: Within Functional Limits for tasks assessed                      General Comments General comments (skin integrity, edema, etc.): Pt was instructed for HEP as he will have minimal therapy due to insurance constraints.    Exercises General Exercises - Lower Extremity Ankle Circles/Pumps: AROM;Both;5 reps Quad Sets: AROM;Both;10 reps Hip ABduction/ADduction: AROM;Both;10 reps      Assessment/Plan    PT Assessment All further PT needs can be met in the next venue of care  PT Diagnosis Abnormality of gait   PT Problem List Decreased strength;Decreased range of motion;Decreased activity tolerance;Decreased balance;Decreased mobility;Decreased skin integrity  PT Treatment Interventions     PT Goals (Current goals can be found in the Care Plan section) Acute Rehab PT Goals Patient Stated Goal: to go home PT Goal Formulation: With patient Time For Goal Achievement: 12/07/14 Potential to Achieve Goals: Good    Frequency     Barriers to discharge        Co-evaluation               End of Session Equipment Utilized During Treatment: Gait belt Activity Tolerance: Patient tolerated treatment well Patient left: in  bed;with call bell/phone within reach;with bed alarm set;with family/visitor present Nurse Communication: Mobility status    Functional Assessment Tool Used: clinical judgment Functional Limitation: Mobility: Walking and moving around Mobility: Walking and Moving Around Current Status (Y7829): At least 1 percent but less than 20 percent impaired, limited or restricted Mobility: Walking and Moving Around Goal Status [phone]): At least 1 percent but less than 20 percent impaired, limited or restricted Mobility: Walking and Moving Around Discharge Status [phone]): At least 1 percent but less than 20 percent impaired, limited or restricted    Time: 8469-6295 PT Time Calculation (min) (ACUTE ONLY): 39 min   Charges:   PT Evaluation $Initial PT Evaluation Tier I: 1 Procedure PT Treatments $Gait Training: 23-37 mins $Therapeutic Exercise: 8-22 mins   PT G Codes:   PT G-Codes **NOT FOR INPATIENT CLASS** Functional Assessment Tool Used: clinical judgment Functional Limitation: Mobility: Walking and moving around Mobility: Walking and Moving Around Current Status (M8413): At least 1 percent but less than 20 percent impaired, limited or restricted Mobility: Walking and Moving Around Goal Status [phone]): At least 1 percent but less than 20 percent impaired, limited or restricted Mobility: Walking and Moving Around Discharge Status [phone]): At least 1 percent but less than 20 percent impaired, limited or restricted    Ramond Dial 12/06/2014, 11:39 AM  Mee Hives, PT MS Acute Rehab Dept. Number: [facility] [id number] and [place] [phone]

## 2014-12-06 NOTE — Progress Notes (Signed)
Pt. Alert and oriented. VSS. Pts. Had several episodes of pain throughout the night eased with meds per MAR. Neurochecks WDL. Tolerating PO's. Using urinal with good output. Resting quietly.

## 2014-12-06 NOTE — Progress Notes (Signed)
  Subjective:  Patient reports pain as moderate.  Patient doing well.  No other complaints.  He is ready for discharge home.  Objective:   VITALS:   Filed Vitals:   12/05/14 1957 12/06/14 0042 12/06/14 0453 12/06/14 0749  BP: 153/87 126/80 132/76 132/75  Pulse: 105 94 93 91  Temp: 98.3 F (36.8 C) 98.7 F (37.1 C) 98.9 F (37.2 C) 98.7 F (37.1 C)  TempSrc: Oral Oral Oral Oral  Resp: 18 18 18 18   Height:      Weight:      SpO2: 99% 100% 100% 100%   Left Lower extremity: Neurologically intact Neurovascular intact Sensation intact distally Intact pulses distally Dorsiflexion/Plantar flexion intact Incision: dressing C/D/I Compartment soft  LABS  No results found for this or any previous visit (from the past 24 hour(s)).  Dg Knee Left Port  12/05/2014   CLINICAL DATA:  Patellar tendon rupture. Patellar tendon repair. Postoperative radiographs.  EXAM: PORTABLE LEFT KNEE - 1-2 VIEW  COMPARISON:  09/02/2014.  FINDINGS: Patellar position has been restored on the lateral and frontal projections. Surgical staples and expected postoperative soft tissue gas noted. The alignment of the knee appears anatomic.  IMPRESSION: Expected postsurgical findings of patellar tendon repair.   Electronically Signed   By: Andreas NewportGeoffrey  Lamke M.D.   On: 12/05/2014 16:33    Assessment/Plan: 1 Day Post-Op   Active Problems:   Patellar tendon rupture  Patient doing well.  He will be evaluated by PT.  Discharge home today.  Follow up in 2 weeks.    Juanell FairlyKRASINSKI, Alycen Mack , MD 12/06/2014, 10:34 AM

## 2014-12-06 NOTE — Discharge Summary (Signed)
Physician Discharge Summary  Patient ID: Jose Lambert MRN: [id number] DOB/AGE: 32/02/1981 32 y.o.  Admit date: 12/05/2014 Discharge date: 12/06/2014  Admission Diagnoses:   LEFT PATELLA TENDON RE-RUPTURE S/P REVISION OPEN FIXATION  Discharge Diagnoses:  Active Problems:   Patellar tendon rupture s/p open repair  Past Medical History  Diagnosis Date  . Medical history non-contributory     Surgeries: Procedure(s): PATELLA TENDON REPAIR on 12/05/2014   Consultants (if any):    Discharged Condition: Improved  Hospital Course: Jose Lambert is an 34 y.o. male who was admitted 12/05/2014 with a diagnosis of rerupture of left patella tendon rupture and went to the operating room on 12/05/2014 and underwent the above named procedures.    He was given perioperative antibiotics:  Anti-infectives    Start     Dose/Rate Route Frequency Ordered Stop   12/05/14 1730  ceFAZolin (ANCEF) IVPB 1 g/50 mL premix     1 g 100 mL/hr over 30 Minutes Intravenous Every 6 hours 12/05/14 1719 12/06/14 0606    . He was admitted post-op for pain control, neurvascular monitoring and IV antibiotics.  He was given sequential compression devices, early ambulation, and ECASA for DVT prophylaxis.  He benefited maximally from the hospital stay and there were no complications.    Recent vital signs:  Filed Vitals:   12/06/14 0749  BP: 132/75  Pulse: 91  Temp: 98.7 F (37.1 C)  Resp: 18    Recent laboratory studies:  Lab Results  Component Value Date   HGB 13.8 12/03/2014   HGB 15.1 09/11/2014   Lab Results  Component Value Date   WBC 6.9 12/03/2014   PLT 343 12/03/2014   Lab Results  Component Value Date   INR 1.13 12/03/2014   Lab Results  Component Value Date   NA 142 12/03/2014   K 4.0 12/03/2014   CL 105 12/03/2014   CO2 27 12/03/2014   BUN 13 12/03/2014   CREATININE 1.26* 12/03/2014   GLUCOSE 91 12/03/2014    Discharge Medications:     Medication List    STOP taking these  medications        HYDROcodone-acetaminophen 5-325 MG per tablet  Commonly known as:  NORCO/VICODIN      TAKE these medications        acetaminophen 325 MG tablet  Commonly known as:  TYLENOL  Take 2 tablets (650 mg total) by mouth every 6 (six) hours as needed for mild pain (or Fever >/= 101).     aspirin 325 MG EC tablet  Take 1 tablet (325 mg total) by mouth 2 (two) times daily.     bisacodyl 5 MG EC tablet  Commonly known as:  DULCOLAX  Take 1 tablet (5 mg total) by mouth daily as needed for moderate constipation.     docusate sodium 100 MG capsule  Commonly known as:  COLACE  Take 1 capsule (100 mg total) by mouth 2 (two) times daily.     oxyCODONE 5 MG immediate release tablet  Commonly known as:  Oxy IR/ROXICODONE  Take 1-2 tablets (5-10 mg total) by mouth every 3 (three) hours as needed for breakthrough pain.        Diagnostic Studies: Dg Knee Left Port  12/05/2014   CLINICAL DATA:  Patellar tendon rupture. Patellar tendon repair. Postoperative radiographs.  EXAM: PORTABLE LEFT KNEE - 1-2 VIEW  COMPARISON:  09/02/2014.  FINDINGS: Patellar position has been restored on the lateral and frontal projections. Surgical staples and expected  postoperative soft tissue gas noted. The alignment of the knee appears anatomic.  IMPRESSION: Expected postsurgical findings of patellar tendon repair.   Electronically Signed   By: Andreas Newport M.D.   On: 12/05/2014 16:33    Disposition: Final discharge disposition not confirmed      Discharge Instructions    Call MD / Call 911    Complete by:  As directed   If you experience chest pain or shortness of breath, CALL 911 and be transported to the hospital emergency room.  If you develope a fever above 101 F, pus (white drainage) or increased drainage or redness at the wound, or calf pain, call your surgeon's office.     Diet general    Complete by:  As directed      Discharge instructions    Complete by:  As directed   Elevate the  left lower extremity whenever possible. Continue to use knee immobilizer at night or when lying in bed or when elevating the operative leg. Patient must leave hinged brace on and locked in extension at all time. May use Polar Care for comfort. Keep dressing clean and dry. Cover the left knee bandage and brace during showers with a plastic bag . Take aspirin  a day for blood clot prevention. Continue to use crutches for assistance with ambulation until follow-up.  Patient is to be toe touch weight bearing on left leg until follow up in 2 weeks.     Driving restrictions    Complete by:  As directed   No driving for 6 weeks     Lifting restrictions    Complete by:  As directed   No lifting for 6 weeks              Signed: Juanell Fairly ,MD 12/06/2014, 10:43 AM

## 2014-12-09 ENCOUNTER — Encounter: Payer: Self-pay | Admitting: Orthopedic Surgery

## 2015-11-22 IMAGING — CR DG KNEE 1-2V PORT*L*
1 series · 2 of 2 positions shown · non-contrast
Comparison: 09/02/2014.

CLINICAL DATA: Patellar tendon rupture. Patellar tendon repair.
Postoperative radiographs.

EXAM:
PORTABLE LEFT KNEE - 1-2 VIEW

[Series 1: ap · 0.17mm/px · 2 of 2 slices shown]
[im 1/2]
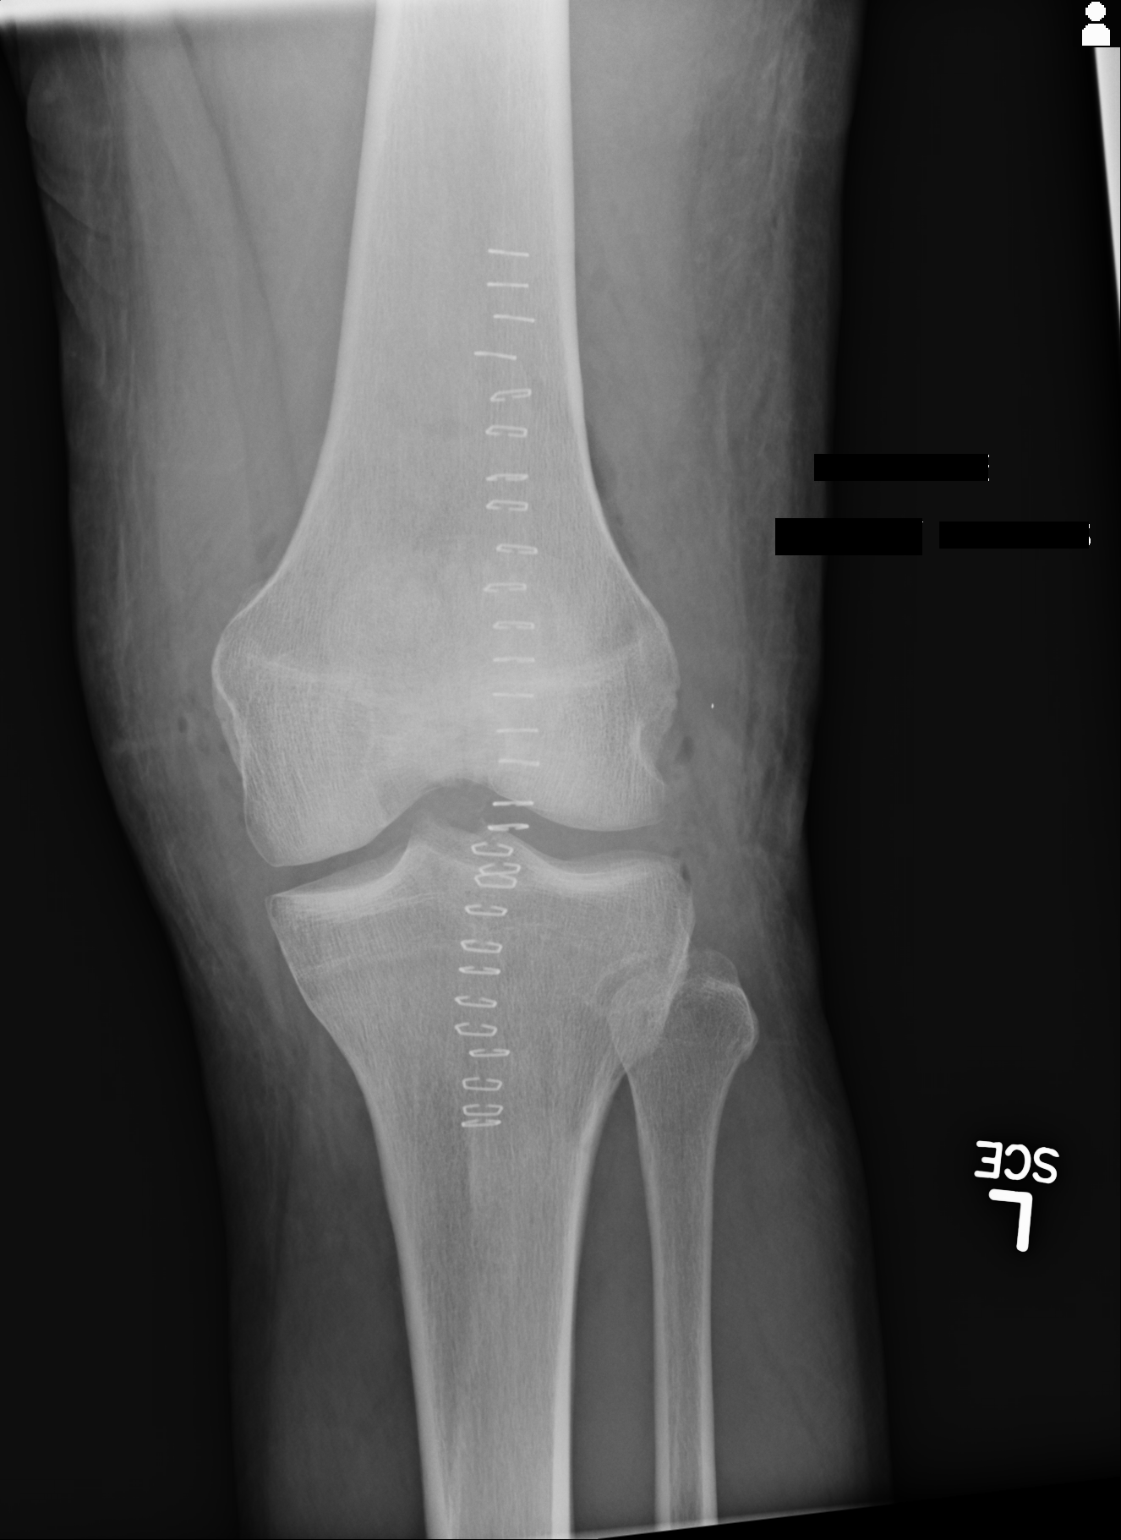
[im 2/2]
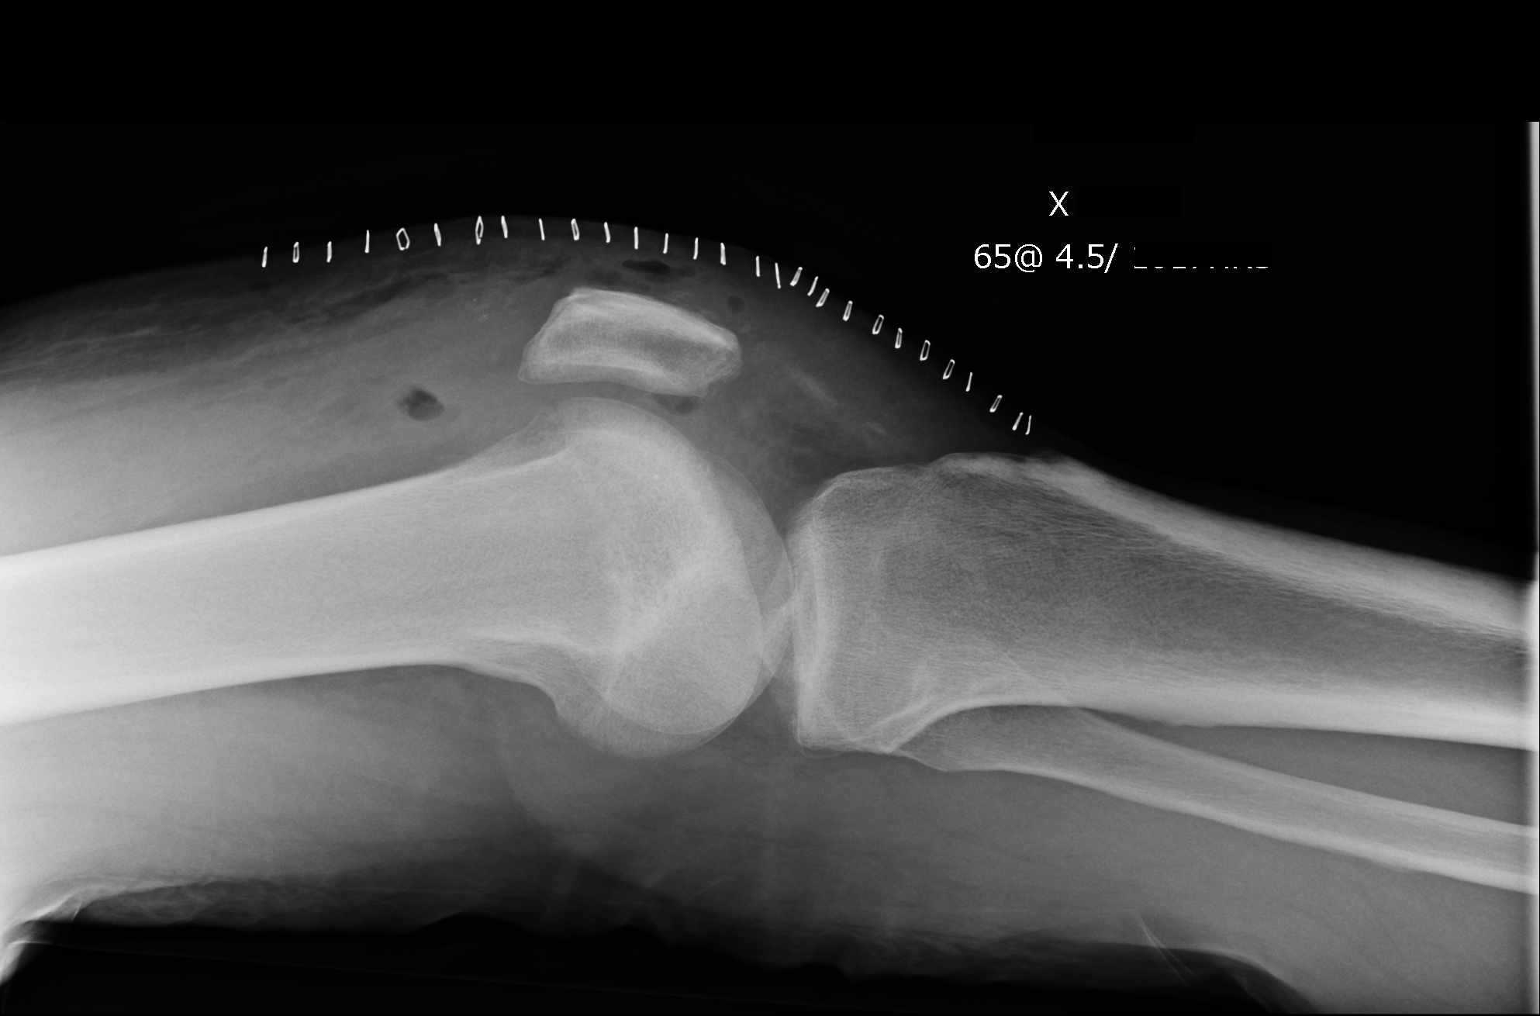

[2 of 2 positions shown; findings below may reference images not displayed]

FINDINGS: Patellar position has been restored on the lateral and frontal
projections. Surgical staples and expected postoperative soft tissue
gas noted. The alignment of the knee appears anatomic.
IMPRESSION: Expected postsurgical findings of patellar tendon repair.

## 2022-08-09 ENCOUNTER — Ambulatory Visit: Payer: Self-pay
